# Patient Record
Sex: Female | Born: 2000 | Race: Black or African American | Hispanic: No | Marital: Single | Smoking: Never smoker
Health system: Southern US, Community
[De-identification: ages and names within clinical notes are randomized; demographics above are authoritative.]

## PROBLEM LIST (undated history)

## (undated) DIAGNOSIS — J45909 Unspecified asthma, uncomplicated: Secondary | ICD-10-CM

---
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
## 2018-07-15 ENCOUNTER — Encounter (HOSPITAL_BASED_OUTPATIENT_CLINIC_OR_DEPARTMENT_OTHER): Payer: Self-pay | Admitting: Emergency Medicine

## 2018-07-15 ENCOUNTER — Other Ambulatory Visit: Payer: Self-pay

## 2018-07-15 ENCOUNTER — Emergency Department (HOSPITAL_BASED_OUTPATIENT_CLINIC_OR_DEPARTMENT_OTHER)
Admission: EM | Admit: 2018-07-15 | Discharge: 2018-07-15 | Disposition: A | Discharge status: Home / Self Care | Payer: Medicaid Other | Attending: Emergency Medicine | Admitting: Emergency Medicine

## 2018-07-15 DIAGNOSIS — K029 Dental caries, unspecified: Secondary | ICD-10-CM | POA: Insufficient documentation

## 2018-07-15 DIAGNOSIS — K0889 Other specified disorders of teeth and supporting structures: Secondary | ICD-10-CM | POA: Insufficient documentation

## 2018-07-15 MED ORDER — PENICILLIN V POTASSIUM 500 MG PO TABS: 500.0000 mg | ORAL_TABLET | Freq: Three times a day (TID) | ORAL | 0 refills | Status: AC

## 2018-07-15 MED FILL — PENICILLIN VK 500 MG TABLET: 500 | 7 days supply | Qty: 21 | Fill #0

## 2018-07-15 NOTE — ED Provider Notes (Signed)
[facility] EMERGENCY DEPARTMENT Provider Note   CSN: [id number] Arrival date & time: 07/15/18  0845     History   Chief Complaint Chief Complaint  Patient presents with  . Dental Pain    HPI Julie Collins is a 18 y.o. female.  HPI Patient is a 18 year old female presents the emergency department with complaints of dental pain worsening over the past 24 to 48 hours.  She reports this is left lower dental pain.  No difficulty breathing or swallowing.  Pain is mild to moderate in severity.  She tried Tylenol last night with some improvement.  She does not have a dentist.  She denies anterior neck pain or swelling.  No other complaints.   History reviewed. No pertinent past medical history.  There are no active problems to display for this patient.   History reviewed. No pertinent surgical history.   OB History   No obstetric history on file.      Home Medications    Prior to Admission medications   Medication Sig Start Date End Date Taking? Authorizing Provider  penicillin v potassium (VEETID) 500 MG tablet Take 1 tablet (500 mg total) by mouth 3 (three) times daily. 07/15/18   Azalia Bilis, MD    Family History No family history on file.  Social History Social History   Tobacco Use  . Smoking status: Never Smoker  . Smokeless tobacco: Never Used  Substance Use Topics  . Alcohol use: Yes  . Drug use: No     Allergies   Patient has no known allergies.   Review of Systems Review of Systems  All other systems reviewed and are negative.    Physical Exam Updated Vital Signs BP (!) 141/75 (BP Location: Left Arm)   Pulse 99   Temp 99.8 F (37.7 C) (Oral)   Resp 16   Ht 5\' 2"  (1.575 m)   Wt 83.9 kg   LMP 07/06/2018   SpO2 99%   BMI 33.84 kg/m   Physical Exam Vitals signs and nursing note reviewed.  Constitutional:      Appearance: She is well-developed.  HENT:     Head: Normocephalic.     Mouth/Throat:     Mouth: Mucous membranes  are moist.     Pharynx: Oropharynx is clear.     Comments: Left lower second molar with mild tenderness.  No gingival swelling or fluctuance.  Space under her tongue is soft.  Tolerating secretions.  Oral airway patent. Neck:     Musculoskeletal: Normal range of motion and neck supple. No muscular tenderness.     Comments: No erythema Pulmonary:     Effort: Pulmonary effort is normal.  Abdominal:     General: There is no distension.  Musculoskeletal: Normal range of motion.  Lymphadenopathy:     Cervical: No cervical adenopathy.  Neurological:     Mental Status: She is alert and oriented to person, place, and time.      ED Treatments / Results  Labs (all labs ordered are listed, but only abnormal results are displayed) Labs Reviewed - No data to display  EKG None  Radiology No results found.  Procedures Procedures (including critical care time)  Medications Ordered in ED Medications - No data to display   Initial Impression / Assessment and Plan / ED Course  I have reviewed the triage vital signs and the nursing notes.  Pertinent labs & imaging results that were available during my care of the patient were reviewed by  me and considered in my medical decision making (see chart for details).     Dental Pain. Home with antibiotics and NSAIDs.  Recommend dental follow up. No signs of gingival abscess. Tolerating secretions. Airway patent. No sub lingular swelling   Final Clinical Impressions(s) / ED Diagnoses   Final diagnoses:  Dental caries  Pain, dental    ED Discharge Orders         Ordered    penicillin v potassium (VEETID) 500 MG tablet  3 times daily     07/15/18 0903           Azalia Bilis, MD 07/15/18 [phone]

## 2018-07-15 NOTE — Discharge Instructions (Addendum)
Take ibuprofen and tylenol for the pain  Please call a dentist for follow up

## 2018-07-15 NOTE — ED Notes (Signed)
ED Provider at bedside. 

## 2018-07-15 NOTE — ED Triage Notes (Signed)
L lower dental pain and swelling since yesterday.

## 2018-07-15 NOTE — ED Notes (Signed)
Pt verbalized understanding of dc instructions.

## 2020-06-30 ENCOUNTER — Ambulatory Visit: Payer: Medicaid Other | Attending: Internal Medicine

## 2020-06-30 DIAGNOSIS — Z23 Encounter for immunization: Secondary | ICD-10-CM

## 2020-06-30 NOTE — Progress Notes (Signed)
° °  Covid-19 Vaccination [facility]  Name:  Julie Collins    MRN: [id number] DOB: [birth year]  06/30/2020  Ms. Julie Collins was observed post Covid-19 immunization for 30 minutes based on pre-vaccination screening without incident. She was provided with Vaccine Information Sheet and instruction to access the V-Safe system.   Ms. Julie Collins was instructed to call 911 with any severe reactions post vaccine:  Difficulty breathing   Swelling of face and throat   A fast heartbeat   A bad rash all over body   Dizziness and weakness   Immunizations Administered    Name Date Dose VIS Date Route   Pfizer COVID-19 Vaccine 06/30/2020 11:53 AM 0.3 mL 04/27/2020 Intramuscular   Manufacturer: [employer], [employer]   Lot: [id number]   NDC: [address]-5

## 2020-10-24 ENCOUNTER — Encounter (HOSPITAL_BASED_OUTPATIENT_CLINIC_OR_DEPARTMENT_OTHER): Payer: Self-pay

## 2020-10-24 ENCOUNTER — Emergency Department (HOSPITAL_BASED_OUTPATIENT_CLINIC_OR_DEPARTMENT_OTHER): Payer: BC Managed Care – PPO

## 2020-10-24 ENCOUNTER — Emergency Department (HOSPITAL_BASED_OUTPATIENT_CLINIC_OR_DEPARTMENT_OTHER)
Admission: EM | Admit: 2020-10-24 | Discharge: 2020-10-24 | Disposition: A | Discharge status: Home / Self Care | Payer: BC Managed Care – PPO | Attending: Emergency Medicine | Admitting: Emergency Medicine

## 2020-10-24 ENCOUNTER — Other Ambulatory Visit: Payer: Self-pay

## 2020-10-24 ENCOUNTER — Other Ambulatory Visit (HOSPITAL_BASED_OUTPATIENT_CLINIC_OR_DEPARTMENT_OTHER): Payer: Self-pay

## 2020-10-24 DIAGNOSIS — F17200 Nicotine dependence, unspecified, uncomplicated: Secondary | ICD-10-CM | POA: Insufficient documentation

## 2020-10-24 DIAGNOSIS — N760 Acute vaginitis: Secondary | ICD-10-CM | POA: Diagnosis not present

## 2020-10-24 DIAGNOSIS — J45909 Unspecified asthma, uncomplicated: Secondary | ICD-10-CM | POA: Insufficient documentation

## 2020-10-24 DIAGNOSIS — Z20822 Contact with and (suspected) exposure to covid-19: Secondary | ICD-10-CM | POA: Diagnosis not present

## 2020-10-24 DIAGNOSIS — J101 Influenza due to other identified influenza virus with other respiratory manifestations: Secondary | ICD-10-CM

## 2020-10-24 DIAGNOSIS — R109 Unspecified abdominal pain: Secondary | ICD-10-CM | POA: Insufficient documentation

## 2020-10-24 DIAGNOSIS — J1089 Influenza due to other identified influenza virus with other manifestations: Secondary | ICD-10-CM | POA: Diagnosis not present

## 2020-10-24 DIAGNOSIS — R509 Fever, unspecified: Secondary | ICD-10-CM | POA: Diagnosis present

## 2020-10-24 DIAGNOSIS — B9689 Other specified bacterial agents as the cause of diseases classified elsewhere: Secondary | ICD-10-CM | POA: Insufficient documentation

## 2020-10-24 HISTORY — DX: Unspecified asthma, uncomplicated: J45.909

## 2020-10-24 LAB — CBC WITH DIFFERENTIAL/PLATELET
Abs Immature Granulocytes: 0.01 10*3/uL (ref 0.00–0.07)
Basophils Absolute: 0 10*3/uL (ref 0.0–0.1)
Basophils Relative: 0 %
Eosinophils Absolute: 0 10*3/uL (ref 0.0–0.5)
Eosinophils Relative: 0 %
HCT: 38.2 % (ref 36.0–46.0)
Hemoglobin: 12.1 g/dL (ref 12.0–15.0)
Immature Granulocytes: 0 %
Lymphocytes Relative: 12 %
Lymphs Abs: 0.4 10*3/uL — ABNORMAL LOW (ref 0.7–4.0)
MCH: 26.4 pg (ref 26.0–34.0)
MCHC: 31.7 g/dL (ref 30.0–36.0)
MCV: 83.2 fL (ref 80.0–100.0)
Monocytes Absolute: 0.5 10*3/uL (ref 0.1–1.0)
Monocytes Relative: 14 %
Neutro Abs: 2.5 10*3/uL (ref 1.7–7.7)
Neutrophils Relative %: 74 %
Platelets: 242 10*3/uL (ref 150–400)
RBC: 4.59 MIL/uL (ref 3.87–5.11)
RDW: 13.6 % (ref 11.5–15.5)
WBC: 3.3 10*3/uL — ABNORMAL LOW (ref 4.0–10.5)
nRBC: 0 % (ref 0.0–0.2)

## 2020-10-24 LAB — COMPREHENSIVE METABOLIC PANEL
ALT: 14 U/L (ref 0–44)
AST: 20 U/L (ref 15–41)
Albumin: 3.7 g/dL (ref 3.5–5.0)
Alkaline Phosphatase: 33 U/L — ABNORMAL LOW (ref 38–126)
Anion gap: 9 (ref 5–15)
BUN: 7 mg/dL (ref 6–20)
CO2: 22 mmol/L (ref 22–32)
Calcium: 8.8 mg/dL — ABNORMAL LOW (ref 8.9–10.3)
Chloride: 104 mmol/L (ref 98–111)
Creatinine, Ser: 0.64 mg/dL (ref 0.44–1.00)
GFR, Estimated: >60 mL/min (ref >60)
Glucose, Bld: 89 mg/dL (ref 70–99)
Potassium: 3.5 mmol/L (ref 3.5–5.1)
Sodium: 135 mmol/L (ref 135–145)
Total Bilirubin: 0.5 mg/dL (ref 0.3–1.2)
Total Protein: 7.7 g/dL (ref 6.5–8.1)

## 2020-10-24 LAB — RESP PANEL BY RT-PCR (FLU A&B, COVID) ARPGX2
Influenza A by PCR: POSITIVE — AB
Influenza B by PCR: NEGATIVE
SARS Coronavirus 2 by RT PCR: NEGATIVE

## 2020-10-24 LAB — URINALYSIS, ROUTINE W REFLEX MICROSCOPIC
Bilirubin Urine: NEGATIVE
Glucose, UA: NEGATIVE mg/dL
Hgb urine dipstick: NEGATIVE
Ketones, ur: 15 mg/dL — AB
Leukocytes,Ua: NEGATIVE
Nitrite: NEGATIVE
Protein, ur: NEGATIVE mg/dL
Specific Gravity, Urine: >1.03 — ABNORMAL HIGH (ref 1.005–1.030)
pH: 6 (ref 5.0–8.0)

## 2020-10-24 LAB — PREGNANCY, URINE: Preg Test, Ur: NEGATIVE

## 2020-10-24 LAB — WET PREP, GENITAL
Sperm: NONE SEEN
Trich, Wet Prep: NONE SEEN
Yeast Wet Prep HPF POC: NONE SEEN

## 2020-10-24 MED ORDER — FENTANYL CITRATE (PF) 100 MCG/2ML IJ SOLN
50.0000 ug | Freq: Once | INTRAMUSCULAR | Status: AC
  Administered 2020-10-24: 50 ug via INTRAVENOUS
  Filled 2020-10-24: qty 2

## 2020-10-24 MED ORDER — ONDANSETRON HCL 4 MG/2ML IJ SOLN
4.0000 mg | Freq: Once | INTRAMUSCULAR | Status: AC
  Administered 2020-10-24: 4 mg via INTRAVENOUS
  Filled 2020-10-24: qty 2

## 2020-10-24 MED ORDER — METRONIDAZOLE 0.75 % VA GEL
1.0000 | Freq: Every day | VAGINAL | 0 refills | Status: AC
  Filled 2020-10-24: qty 70, 7d supply, fill #0

## 2020-10-24 MED ORDER — OSELTAMIVIR PHOSPHATE 75 MG PO CAPS
75.0000 mg | ORAL_CAPSULE | Freq: Once | ORAL | Status: AC
  Administered 2020-10-24: 75 mg via ORAL
  Filled 2020-10-24: qty 1

## 2020-10-24 MED ORDER — KETOROLAC TROMETHAMINE 30 MG/ML IJ SOLN
30.0000 mg | Freq: Once | INTRAMUSCULAR | Status: AC
  Administered 2020-10-24: 30 mg via INTRAVENOUS
  Filled 2020-10-24: qty 1

## 2020-10-24 MED ORDER — OSELTAMIVIR PHOSPHATE 75 MG PO CAPS
75.0000 mg | ORAL_CAPSULE | Freq: Two times a day (BID) | ORAL | 0 refills | Status: AC
  Filled 2020-10-24: qty 10, 5d supply, fill #0

## 2020-10-24 MED ORDER — SODIUM CHLORIDE 0.9 % IV BOLUS
1000.0000 mL | Freq: Once | INTRAVENOUS | Status: AC
  Administered 2020-10-24: 1000 mL via INTRAVENOUS

## 2020-10-24 NOTE — ED Provider Notes (Signed)
[facility] EMERGENCY DEPARTMENT Provider Note   CSN: [id number] Arrival date & time: 10/24/20  3875     History Chief Complaint  Patient presents with  . Flank Pain    Julie Collins is a 20 y.o. female.  She took a home COVID test, and it was negative. Pain has been so severe that she has awoken from sleep due to pain.  The history is provided by the patient.  Back Pain Location:  Lumbar spine Quality:  Stabbing Radiates to:  Does not radiate Pain severity:  Severe Pain is:  Same all the time Onset quality:  Gradual Duration:  3 days Timing:  Constant Progression:  Worsening Chronicity:  New Context: not physical stress, not recent illness, not recent injury and not twisting   Relieved by:  Nothing Worsened by:  Nothing Ineffective treatments:  Ibuprofen Associated symptoms: bladder incontinence (feels the urge, dribbling on the way to the toilet), fever (subjective; drenched in sweat) and weakness (diffuse)   Associated symptoms: no abdominal pain, no bowel incontinence, no chest pain, no dysuria, no numbness and no paresthesias   Risk factors comment:  Fully vaccinated and boosted for COVID      Past Medical History:  Diagnosis Date  . Asthma     There are no problems to display for this patient.   No past surgical history on file.   OB History   No obstetric history on file.     No family history on file.  Social History   Tobacco Use  . Smoking status: Current Every Day Smoker  . Smokeless tobacco: Never Used  Substance Use Topics  . Alcohol use: Yes  . Drug use: No    Home Medications Prior to Admission medications   Medication Sig Start Date End Date Taking? Authorizing Provider  penicillin v potassium (VEETID) 500 MG tablet Take 1 tablet (500 mg total) by mouth 3 (three) times daily. 07/15/18   Azalia Bilis, MD    Allergies    Penicillins  Review of Systems   Review of Systems  Constitutional: Positive for fever  (subjective; drenched in sweat). Negative for chills.  HENT: Negative for ear pain and sore throat.   Eyes: Negative for pain and visual disturbance.  Respiratory: Positive for cough (mild; attributed to allergies). Negative for shortness of breath.   Cardiovascular: Negative for chest pain and palpitations.  Gastrointestinal: Positive for constipation and nausea. Negative for abdominal pain, bowel incontinence, diarrhea and vomiting.  Genitourinary: Positive for bladder incontinence (feels the urge, dribbling on the way to the toilet) and vaginal discharge (malodorous). Negative for dysuria and hematuria.  Musculoskeletal: Positive for back pain. Negative for arthralgias.  Skin: Negative for color change and rash.  Neurological: Positive for weakness (diffuse). Negative for seizures, syncope, numbness and paresthesias.  All other systems reviewed and are negative.   Physical Exam Updated Vital Signs BP (!) 146/93 (BP Location: Right Arm)   Pulse 82   Temp 98.5 F (36.9 C) (Oral)   Resp 18   Ht 5\' 3"  (1.6 m)   Wt 89.4 kg   SpO2 98%   BMI 34.90 kg/m   Physical Exam Vitals and nursing note reviewed. Exam conducted with a chaperone present.  Constitutional:      Comments: Tearful, appears to be in pain  HENT:     Head: Normocephalic and atraumatic.  Eyes:     General: No scleral icterus. Pulmonary:     Effort: Pulmonary effort is normal. No respiratory distress.  Abdominal:     Tenderness: There is abdominal tenderness. There is left CVA tenderness. There is no guarding or rebound.     Comments: Mild LLQ tenderness  Genitourinary:    General: Normal vulva.     Labia:        Right: No rash, tenderness or lesion.        Left: No rash, tenderness or lesion.      Vagina: Vaginal discharge (white) present.     Cervix: Cervical motion tenderness present.     Uterus: Normal.      Adnexa: Right adnexa normal and left adnexa normal.     Comments: CMT might be more uterine/bladder  as all pain was midline Musculoskeletal:     Cervical back: Normal range of motion.     Comments: Mild, diffuse tenderness over the lumbar spine  Skin:    General: Skin is warm and dry.  Neurological:     General: No focal deficit present.     Mental Status: She is alert and oriented to person, place, and time.     Sensory: No sensory deficit.  Psychiatric:        Mood and Affect: Mood normal.     ED Results / Procedures / Treatments   Labs (all labs ordered are listed, but only abnormal results are displayed) Labs Reviewed  RESP PANEL BY RT-PCR (FLU A&B, COVID) ARPGX2 - Abnormal; Notable for the following components:      Result Value   Influenza A by PCR POSITIVE (*)    All other components within normal limits  WET PREP, GENITAL - Abnormal; Notable for the following components:   Clue Cells Wet Prep HPF POC PRESENT (*)    WBC, Wet Prep HPF POC MODERATE (*)    All other components within normal limits  COMPREHENSIVE METABOLIC PANEL - Abnormal; Notable for the following components:   Calcium 8.8 (*)    Alkaline Phosphatase 33 (*)    All other components within normal limits  CBC WITH DIFFERENTIAL/PLATELET - Abnormal; Notable for the following components:   WBC 3.3 (*)    Lymphs Abs 0.4 (*)    All other components within normal limits  URINALYSIS, ROUTINE W REFLEX MICROSCOPIC - Abnormal; Notable for the following components:   Specific Gravity, Urine >1.030 (*)    Ketones, ur 15 (*)    All other components within normal limits  PREGNANCY, URINE  GC/CHLAMYDIA PROBE AMP ([place]) NOT AT [facility]    EKG None  Radiology CT Renal Stone Study  Result Date: 10/24/2020 CLINICAL DATA:  Flank pain EXAM: CT ABDOMEN AND PELVIS WITHOUT CONTRAST TECHNIQUE: Multidetector CT imaging of the abdomen and pelvis was performed following the standard protocol without IV contrast. COMPARISON:  July 2021 FINDINGS: Lower chest: No acute abnormality. Hepatobiliary: Liver dome is partially  imaged. No focal liver abnormality identified. Gallbladder is unremarkable. No biliary dilatation. Pancreas: Unremarkable. Spleen: Unremarkable. Adrenals/Urinary Tract: Adrenals are unremarkable. There is no hydronephrosis. No renal calculi. Bladder is poorly distended. Stomach/Bowel: Stomach is within normal limits. Bowel is normal in caliber. Vascular/Lymphatic: No significant vascular abnormality. No enlarged lymph nodes identified. Reproductive: Uterus and bilateral adnexa are unremarkable. Other: Abdominal wall is unremarkable. Musculoskeletal: No acute osseous abnormality. IMPRESSION: No acute abnormality. Electronically Signed   By: Guadlupe Spanish M.D.   On: 10/24/2020 10:00    Procedures Procedures   Medications Ordered in ED Medications  sodium chloride 0.9 % bolus 1,000 mL (has no administration in time range)  ketorolac (  TORADOL) 30 MG/ML injection 30 mg (has no administration in time range)    ED Course  I have reviewed the triage vital signs and the nursing notes.  Pertinent labs & imaging results that were available during my care of the patient were reviewed by me and considered in my medical decision making (see chart for details).    MDM Rules/Calculators/A&P                          Ms. Paulita Fujita presented to the ED complaining of severe back pain.  She was endorsing some subjective fevers and chills, and I was concerned about COVID-19 as her symptoms seem to be related to myalgias.  However, I was also concerned about other etiologies of her symptoms such as kidney stone, pyelonephritis, pelvic inflammatory disease, and much less likely given her lack of risk factors, spinal epidural abscess or other infectious lumbosacral process.  She tested positive for influenza A which is likely the cause of her symptoms.  She will be given Tamiflu.  She did also test positive for BV and has been experiencing some malodorous discharge.  Therefore, I will give her metronidazole. Final  Clinical Impression(s) / ED Diagnoses Final diagnoses:  Influenza A  Bacterial vaginitis    Rx / DC Orders ED Discharge Orders         Ordered    oseltamivir (TAMIFLU) 75 MG capsule  Every 12 hours        10/24/20 1012    metroNIDAZOLE (METROGEL VAGINAL) 0.75 % vaginal gel  Daily at bedtime        10/24/20 1013           Koleen Distance, MD 10/24/20 1015

## 2020-10-24 NOTE — ED Triage Notes (Signed)
Bilateral flank pain "a little worse on the right" with nausea since Saturday afternoon, denies injury.

## 2020-10-25 LAB — GC/CHLAMYDIA PROBE AMP (~~LOC~~) NOT AT ARMC
Chlamydia: NEGATIVE
Comment: NEGATIVE
Comment: NORMAL
Neisseria Gonorrhea: NEGATIVE

## 2021-10-26 ENCOUNTER — Encounter (HOSPITAL_COMMUNITY): Payer: Self-pay | Admitting: Emergency Medicine

## 2021-10-26 ENCOUNTER — Other Ambulatory Visit: Payer: Self-pay

## 2021-10-26 ENCOUNTER — Emergency Department (HOSPITAL_COMMUNITY)
Admission: EM | Admit: 2021-10-26 | Discharge: 2021-10-26 | Disposition: A | Discharge status: Home / Self Care | Payer: Medicaid Other | Attending: Emergency Medicine | Admitting: Emergency Medicine

## 2021-10-26 DIAGNOSIS — R45851 Suicidal ideations: Secondary | ICD-10-CM | POA: Insufficient documentation

## 2021-10-26 DIAGNOSIS — F419 Anxiety disorder, unspecified: Secondary | ICD-10-CM | POA: Diagnosis not present

## 2021-10-26 DIAGNOSIS — Z046 Encounter for general psychiatric examination, requested by authority: Secondary | ICD-10-CM | POA: Diagnosis not present

## 2021-10-26 DIAGNOSIS — F322 Major depressive disorder, single episode, severe without psychotic features: Secondary | ICD-10-CM | POA: Insufficient documentation

## 2021-10-26 LAB — COMPREHENSIVE METABOLIC PANEL
ALT: 15 U/L (ref 0–44)
AST: 19 U/L (ref 15–41)
Albumin: 4.2 g/dL (ref 3.5–5.0)
Alkaline Phosphatase: 37 U/L — ABNORMAL LOW (ref 38–126)
Anion gap: 9 (ref 5–15)
BUN: 9 mg/dL (ref 6–20)
CO2: 20 mmol/L — ABNORMAL LOW (ref 22–32)
Calcium: 9.2 mg/dL (ref 8.9–10.3)
Chloride: 106 mmol/L (ref 98–111)
Creatinine, Ser: 0.7 mg/dL (ref 0.44–1.00)
GFR, Estimated: >60 mL/min (ref >60)
Glucose, Bld: 89 mg/dL (ref 70–99)
Potassium: 3.8 mmol/L (ref 3.5–5.1)
Sodium: 135 mmol/L (ref 135–145)
Total Bilirubin: 0.9 mg/dL (ref 0.3–1.2)
Total Protein: 8 g/dL (ref 6.5–8.1)

## 2021-10-26 LAB — CBC
HCT: 37.4 % (ref 36.0–46.0)
Hemoglobin: 12.5 g/dL (ref 12.0–15.0)
MCH: 28.2 pg (ref 26.0–34.0)
MCHC: 33.4 g/dL (ref 30.0–36.0)
MCV: 84.4 fL (ref 80.0–100.0)
Platelets: 295 10*3/uL (ref 150–400)
RBC: 4.43 MIL/uL (ref 3.87–5.11)
RDW: 13.1 % (ref 11.5–15.5)
WBC: 8.9 10*3/uL (ref 4.0–10.5)
nRBC: 0 % (ref 0.0–0.2)

## 2021-10-26 LAB — ETHANOL: Alcohol, Ethyl (B): <10 mg/dL (ref <10)

## 2021-10-26 LAB — HCG, QUANTITATIVE, PREGNANCY: hCG, Beta Chain, Quant, S: 2986 m[IU]/mL — ABNORMAL HIGH (ref <5)

## 2021-10-26 LAB — RAPID URINE DRUG SCREEN, HOSP PERFORMED
Amphetamines: NOT DETECTED
Barbiturates: NOT DETECTED
Benzodiazepines: NOT DETECTED
Cocaine: NOT DETECTED
Opiates: NOT DETECTED
Tetrahydrocannabinol: POSITIVE — AB

## 2021-10-26 LAB — I-STAT BETA HCG BLOOD, ED (MC, WL, AP ONLY): I-stat hCG, quantitative: >2000 m[IU]/mL — ABNORMAL HIGH (ref <5)

## 2021-10-26 LAB — SALICYLATE LEVEL: Salicylate Lvl: <7 mg/dL — ABNORMAL LOW (ref 7.0–30.0)

## 2021-10-26 LAB — ACETAMINOPHEN LEVEL: Acetaminophen (Tylenol), Serum: <10 ug/mL — ABNORMAL LOW (ref 10–30)

## 2021-10-26 NOTE — BH Assessment (Signed)
TTS completed, discussed clinicals with Dr. Nelly Rout and she recommends psych clearance. Patient does not meet criteria for inpatient psychiatric treatment. Please rescind IVC. Patient ok to discharge home with her mother. Resources added to AVS to the [facility] and [facility] the [place] for outpatient therapy and medication management.  ?

## 2021-10-26 NOTE — Discharge Instructions (Signed)
Begin taking a prenatal vitamin asap in until you decide what you want to do. ?The easiest way to [facility] is to go to there website. ?Get help right away if you feel like you want to hurt yourself or anyone else. ? ?
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
## 2021-10-26 NOTE — ED Notes (Addendum)
Pt was upset and emotional due to she was informed by provider she is pregnant. Pt expressed she wanted female staff member to "vent" with. I had a female tech to come in and talk with pt regarding this. Pt expressed appreciation for this.  ?
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
## 2021-10-26 NOTE — ED Notes (Signed)
I provided reinforced discharge education based off of discharge instructions. Pt acknowledged and understood my education. Pt had no further questions/concerns for provider/myself.  °

## 2021-10-26 NOTE — ED Notes (Signed)
Pt belonging: white shoes, blue jean, white shirt, one set of car keys, white earring, gold chain  ?
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
## 2021-10-26 NOTE — ED Notes (Signed)
Pt provided ginger ale per request.

## 2022-07-13 ENCOUNTER — Other Ambulatory Visit: Payer: Self-pay

## 2022-07-13 ENCOUNTER — Emergency Department (HOSPITAL_BASED_OUTPATIENT_CLINIC_OR_DEPARTMENT_OTHER)
Admission: EM | Admit: 2022-07-13 | Discharge: 2022-07-13 | Discharge status: Against Medical Advice | Payer: Medicaid Other | Attending: Emergency Medicine | Admitting: Emergency Medicine

## 2022-07-13 ENCOUNTER — Encounter (HOSPITAL_BASED_OUTPATIENT_CLINIC_OR_DEPARTMENT_OTHER): Payer: Self-pay | Admitting: Emergency Medicine

## 2022-07-13 DIAGNOSIS — Z5321 Procedure and treatment not carried out due to patient leaving prior to being seen by health care provider: Secondary | ICD-10-CM | POA: Insufficient documentation

## 2022-07-13 DIAGNOSIS — R350 Frequency of micturition: Secondary | ICD-10-CM | POA: Diagnosis present

## 2022-07-13 DIAGNOSIS — N644 Mastodynia: Secondary | ICD-10-CM | POA: Diagnosis not present

## 2022-07-13 DIAGNOSIS — R102 Pelvic and perineal pain: Secondary | ICD-10-CM | POA: Insufficient documentation

## 2022-07-13 LAB — URINALYSIS, ROUTINE W REFLEX MICROSCOPIC
Bilirubin Urine: NEGATIVE
Glucose, UA: NEGATIVE mg/dL
Hgb urine dipstick: NEGATIVE
Ketones, ur: NEGATIVE mg/dL
Leukocytes,Ua: NEGATIVE
Nitrite: NEGATIVE
Protein, ur: NEGATIVE mg/dL
Specific Gravity, Urine: 1.015 (ref 1.005–1.030)
pH: 7 (ref 5.0–8.0)

## 2022-07-13 LAB — HCG, QUANTITATIVE, PREGNANCY: hCG, Beta Chain, Quant, S: <1 m[IU]/mL (ref <5)

## 2022-07-13 LAB — PREGNANCY, URINE: Preg Test, Ur: NEGATIVE

## 2022-07-13 NOTE — ED Provider Triage Note (Signed)
Emergency Medicine Provider Triage Evaluation Note  Julie Collins , a 22 y.o. female  was evaluated in triage.  Pt complains of possible pregnancy.  Notes increased breast tenderness, increased urinary frequency, pelvic pain, and 2 months late for menstrual cycle..  No hx of irregular cycles per pt.  States these symptoms occurred last time she was pregnant.  Denies vaginal discharge or bleeding.  Denies dysuria.  Denies fever or chills.  Tried two home pregnancy tests at home, the first was negative, the second she believes may have had a faint positive test.  Review of Systems  Positive:  Negative: See above  Physical Exam  BP (!) 170/97 (BP Location: Left Arm)   Pulse 77   Temp 97.7 F (36.5 C) (Oral)   Resp 16   Wt 88.9 kg   LMP 05/27/2022   SpO2 94%   BMI 34.72 kg/m  Gen:   Awake, no distress   Resp:  Normal effort  MSK:   Moves extremities without difficulty  Other:  Sitting comfortably. Abdomen soft, mild suprapubic tenderness.  Nondistended. No flank tenderness.  Medical Decision Making  Medically screening exam initiated at 1:14 PM.  Appropriate orders placed.  Julie Collins was informed that the remainder of the evaluation will be completed by another provider, this initial triage assessment does not replace that evaluation, and the importance of remaining in the ED until their evaluation is complete.  Urine pregnancy negative.  Discussed blood analysis vs urine analysis, pt elects to proceed with blood analysis to be sure.  UA ordered to assess for possible UTI.   Prince Rome, PA-C 64/33/29 1323

## 2022-07-13 NOTE — ED Notes (Signed)
Called in [place], 2nd call no answer

## 2022-07-13 NOTE — ED Triage Notes (Signed)
Pt arrives pov, steady gait, reports taking plan B in November, requesting pregnancy test. Endorses pelvic pain today

## 2022-07-13 NOTE — ED Notes (Signed)
Called in [place], no answer

## 2022-07-13 NOTE — ED Notes (Signed)
Called in [place], no answer, 3rd call

## 2022-07-13 NOTE — ED Notes (Signed)
Attempted lab collect X1 L AC, unsuccessful. Attempted X2 L hand and was able to collect ordered labs

## 2022-07-19 ENCOUNTER — Encounter: Payer: Medicaid Other | Admitting: Nurse Practitioner

## 2022-07-27 ENCOUNTER — Encounter: Payer: Medicaid Other | Admitting: Radiology

## 2023-01-18 ENCOUNTER — Ambulatory Visit: Payer: Medicaid Other | Admitting: Nurse Practitioner

## 2023-01-25 ENCOUNTER — Ambulatory Visit: Payer: Self-pay | Admitting: Family Medicine

## 2023-01-25 ENCOUNTER — Ambulatory Visit: Payer: Medicaid Other | Admitting: Nurse Practitioner

## 2023-04-16 IMAGING — CT CT RENAL STONE PROTOCOL
2 of 4 series · 17 of 46 positions shown, 19 images · non-contrast
Comparison: January 2020

CLINICAL DATA: Flank pain

EXAM:
CT ABDOMEN AND PELVIS WITHOUT CONTRAST
TECHNIQUE: Multidetector CT imaging of the abdomen and pelvis was performed
following the standard protocol without IV contrast.

[Series 2: axial st · axial · 0.98mm/px · z∈[-468,-93]mm · 14 of 81 slices shown, 16 images]
[im 3/81  soft-tissue]
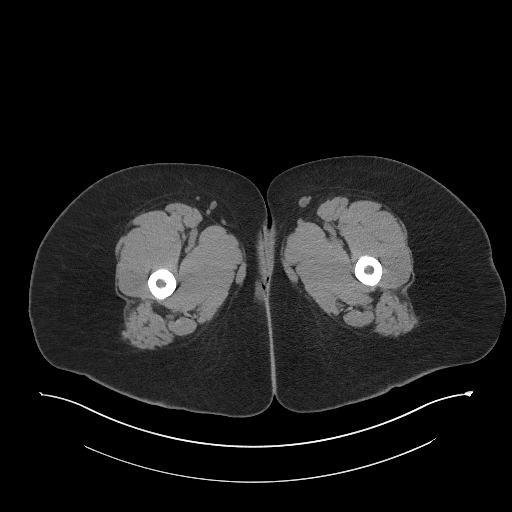
[im 3/81  bone]
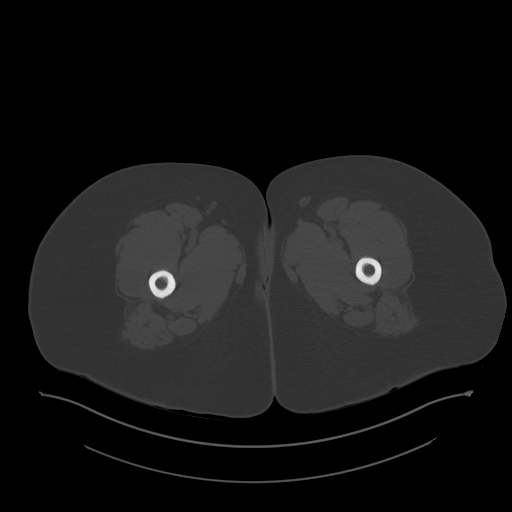
[im 9/81  soft-tissue]
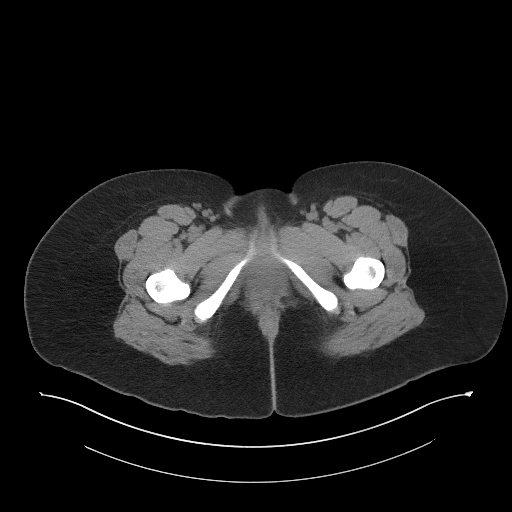
[im 15/81  soft-tissue]
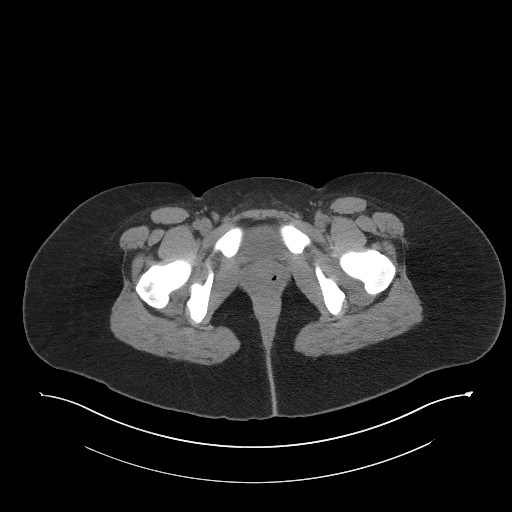
[im 21/81  soft-tissue]
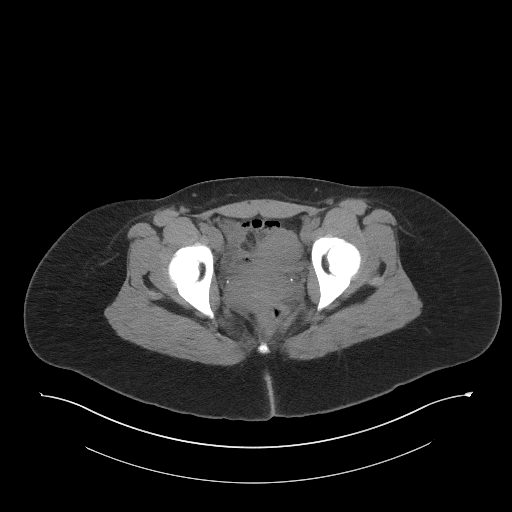
[im 27/81  soft-tissue]
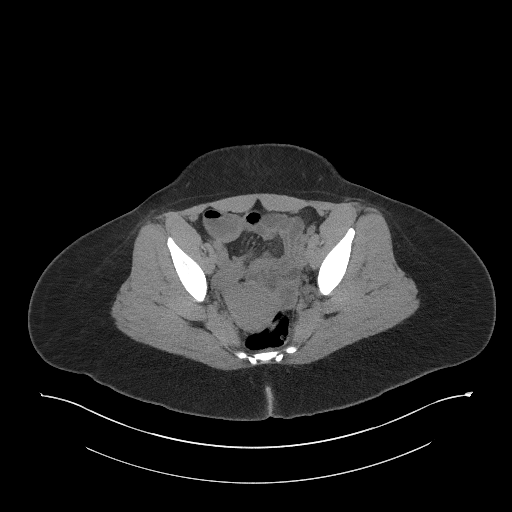
[im 33/81  soft-tissue]
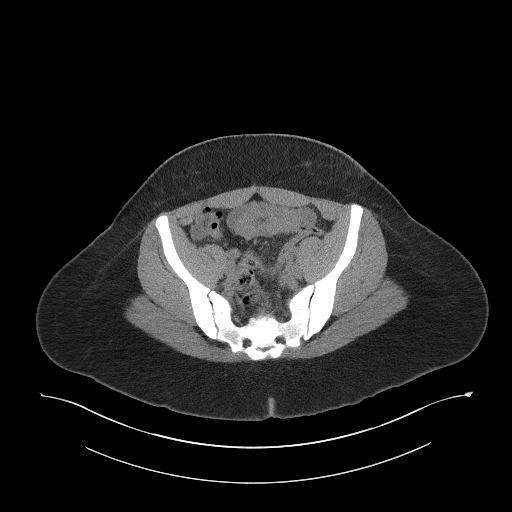
[im 39/81  soft-tissue]
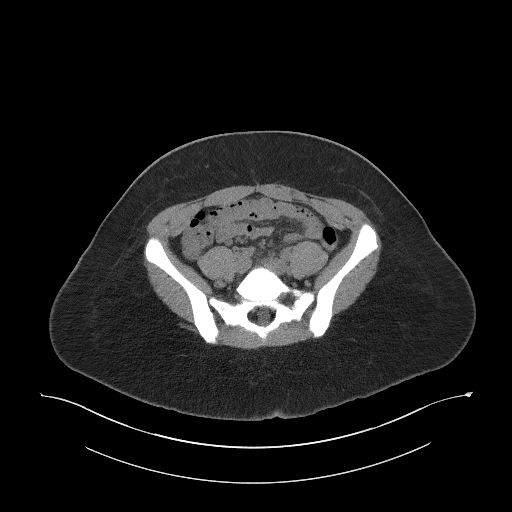
[im 42/81  soft-tissue]
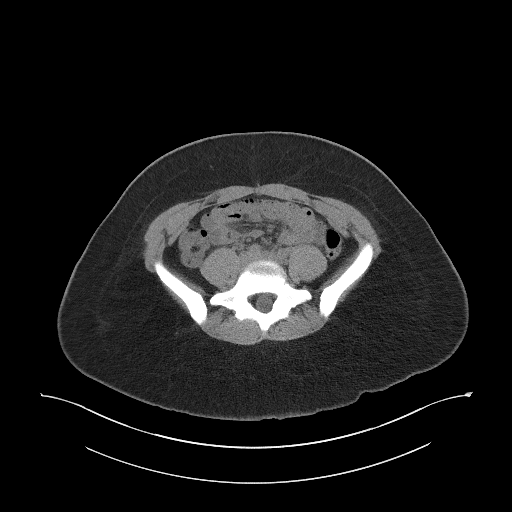
[im 48/81  soft-tissue]
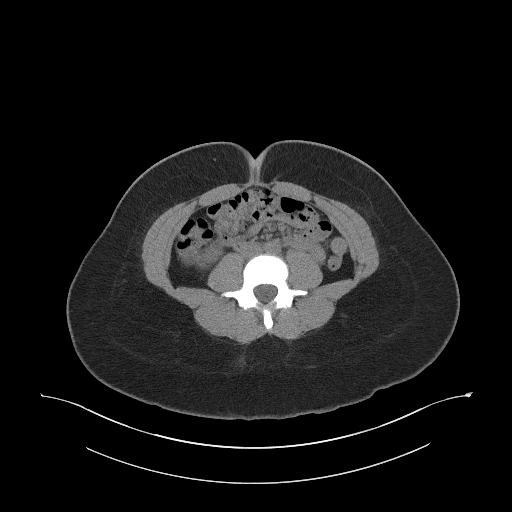
[im 48/81  bone]
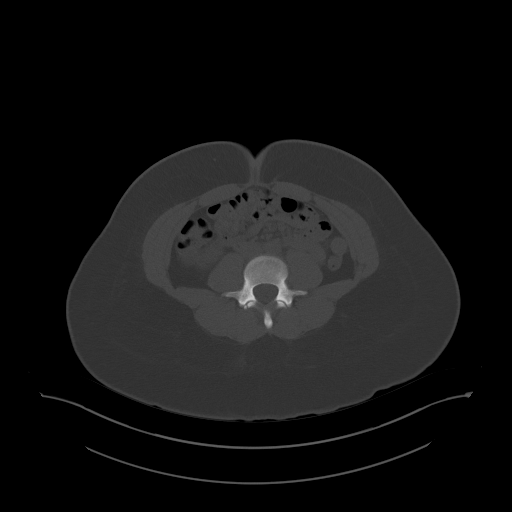
[im 54/81  soft-tissue]
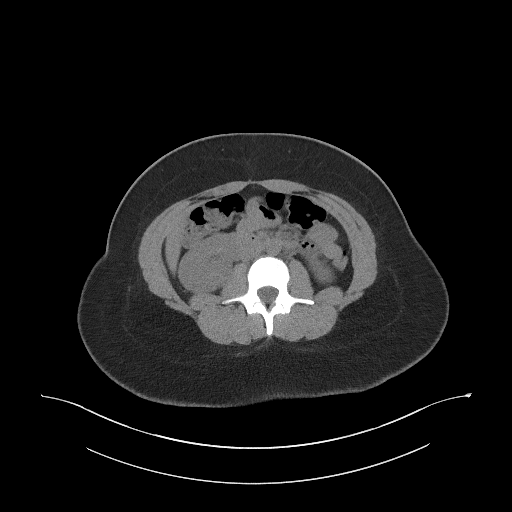
[im 60/81  soft-tissue]
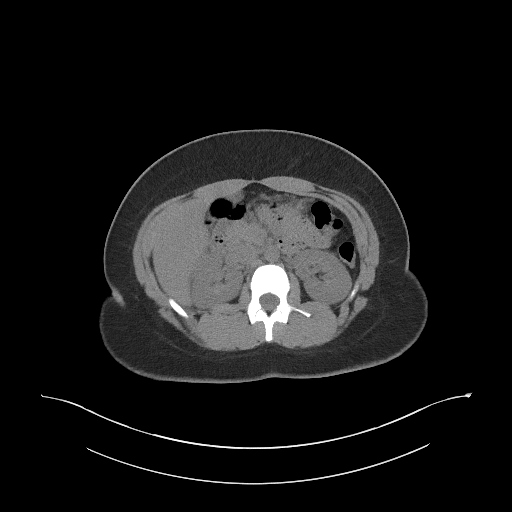
[im 66/81  soft-tissue]
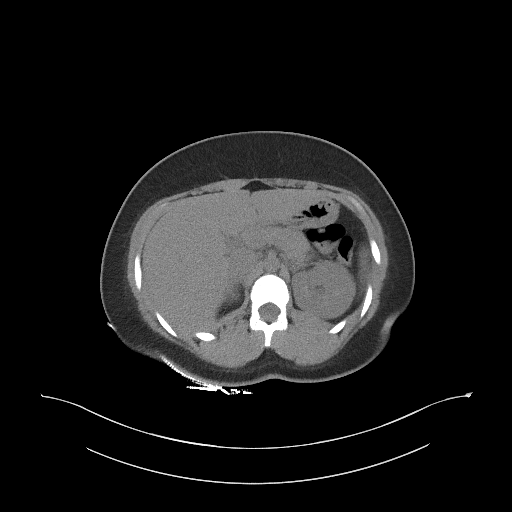
[im 72/81  soft-tissue]
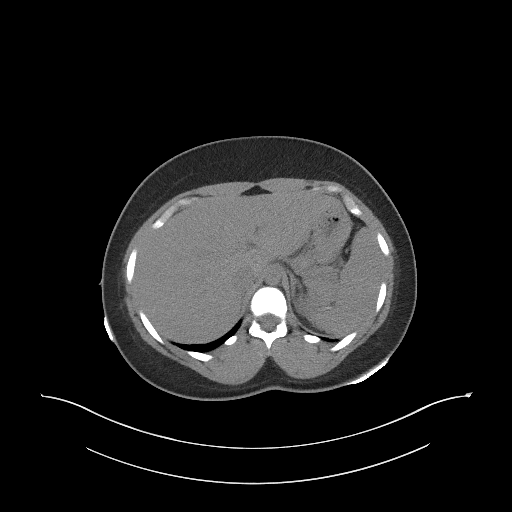
[im 78/81  soft-tissue]
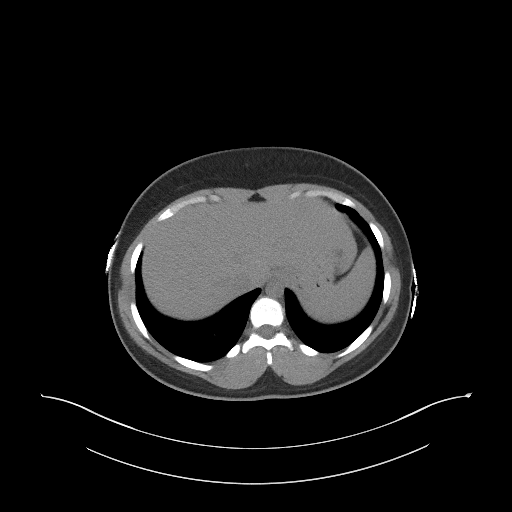

[Series 5: coronal st · coronal · 0.86mm/px · 3 of 97 slices shown]
[im 33/97  soft-tissue]
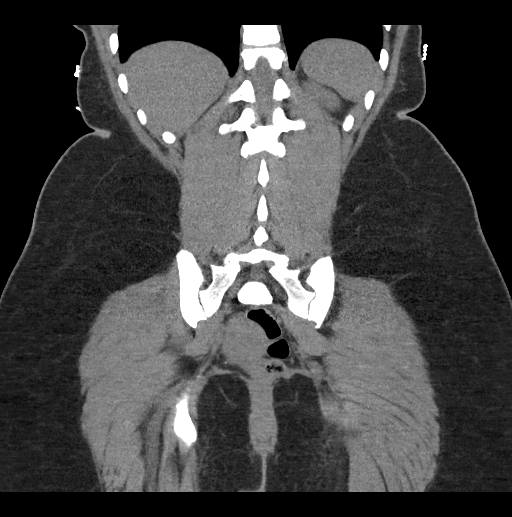
[im 43/97  soft-tissue]
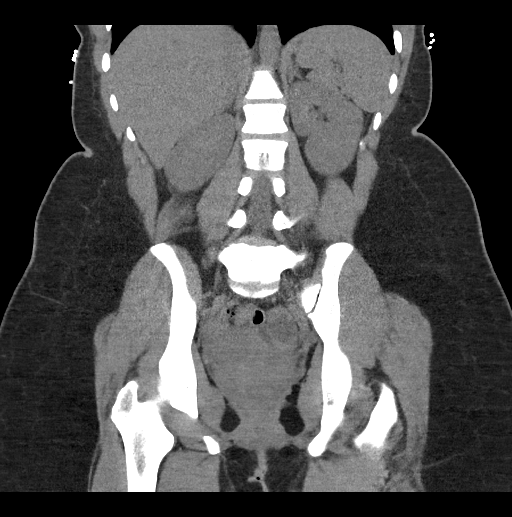
[im 54/97  soft-tissue]
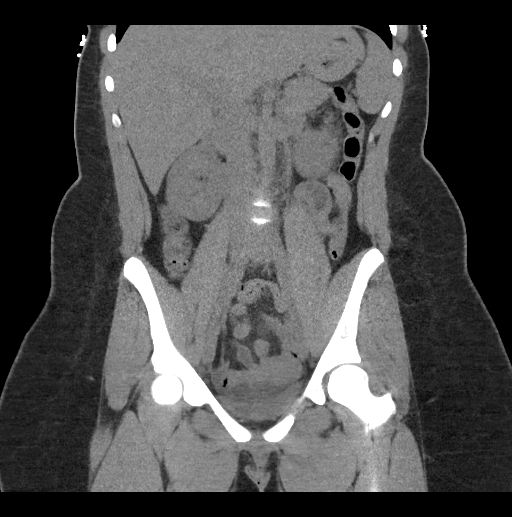

[17 of 46 positions shown; findings below may reference images not displayed]

FINDINGS: Lower chest: No acute abnormality.

Hepatobiliary: Liver dome is partially imaged. No focal liver
abnormality identified. Gallbladder is unremarkable. No biliary
dilatation.

Pancreas: Unremarkable.

Spleen: Unremarkable.

Adrenals/Urinary Tract: Adrenals are unremarkable. There is no
hydronephrosis. No renal calculi. Bladder is poorly distended.

Stomach/Bowel: Stomach is within normal limits. Bowel is normal in
caliber.

Vascular/Lymphatic: No significant vascular abnormality. No enlarged
lymph nodes identified.

Reproductive: Uterus and bilateral adnexa are unremarkable.

Other: Abdominal wall is unremarkable.

Musculoskeletal: No acute osseous abnormality.
IMPRESSION: No acute abnormality.

## 2023-06-13 ENCOUNTER — Emergency Department (HOSPITAL_BASED_OUTPATIENT_CLINIC_OR_DEPARTMENT_OTHER)
Admission: EM | Admit: 2023-06-13 | Discharge: 2023-06-14 | Disposition: A | Discharge status: Home / Self Care | Payer: Medicaid Other | Attending: Emergency Medicine | Admitting: Emergency Medicine

## 2023-06-13 ENCOUNTER — Other Ambulatory Visit: Payer: Self-pay

## 2023-06-13 ENCOUNTER — Encounter (HOSPITAL_BASED_OUTPATIENT_CLINIC_OR_DEPARTMENT_OTHER): Payer: Self-pay

## 2023-06-13 DIAGNOSIS — M549 Dorsalgia, unspecified: Secondary | ICD-10-CM | POA: Diagnosis present

## 2023-06-13 DIAGNOSIS — M7918 Myalgia, other site: Secondary | ICD-10-CM

## 2023-06-13 DIAGNOSIS — Y92410 Unspecified street and highway as the place of occurrence of the external cause: Secondary | ICD-10-CM | POA: Insufficient documentation

## 2023-06-13 DIAGNOSIS — M62830 Muscle spasm of back: Secondary | ICD-10-CM | POA: Diagnosis not present
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
## 2023-06-14 MED ORDER — IBUPROFEN 400 MG PO TABS
600.0000 mg | ORAL_TABLET | Freq: Once | ORAL | Status: AC
  Administered 2023-06-14: 600 mg via ORAL
  Filled 2023-06-14: qty 1

## 2023-06-14 MED ORDER — METHOCARBAMOL 500 MG PO TABS: 500.0000 mg | ORAL_TABLET | Freq: Two times a day (BID) | ORAL | 0 refills | Status: AC

## 2023-06-14 MED ORDER — NAPROXEN 500 MG PO TABS: 500.0000 mg | ORAL_TABLET | Freq: Two times a day (BID) | ORAL | 0 refills | Status: AC
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
## 2023-06-18 ENCOUNTER — Emergency Department (HOSPITAL_BASED_OUTPATIENT_CLINIC_OR_DEPARTMENT_OTHER): Payer: Medicaid Other

## 2023-06-18 ENCOUNTER — Encounter (HOSPITAL_BASED_OUTPATIENT_CLINIC_OR_DEPARTMENT_OTHER): Payer: Self-pay | Admitting: Emergency Medicine

## 2023-06-18 ENCOUNTER — Emergency Department (HOSPITAL_BASED_OUTPATIENT_CLINIC_OR_DEPARTMENT_OTHER)
Admission: EM | Admit: 2023-06-18 | Discharge: 2023-06-18 | Disposition: A | Discharge status: Home / Self Care | Payer: Medicaid Other | Attending: Emergency Medicine | Admitting: Emergency Medicine

## 2023-06-18 ENCOUNTER — Other Ambulatory Visit: Payer: Self-pay

## 2023-06-18 DIAGNOSIS — Y92410 Unspecified street and highway as the place of occurrence of the external cause: Secondary | ICD-10-CM | POA: Insufficient documentation

## 2023-06-18 DIAGNOSIS — M25561 Pain in right knee: Secondary | ICD-10-CM | POA: Diagnosis present

## 2023-06-18 DIAGNOSIS — S8991XA Unspecified injury of right lower leg, initial encounter: Secondary | ICD-10-CM | POA: Diagnosis not present

## 2023-06-18 DIAGNOSIS — J45909 Unspecified asthma, uncomplicated: Secondary | ICD-10-CM | POA: Diagnosis not present
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
## 2023-06-18 NOTE — ED Notes (Signed)
Reviewed D/C information with the patient, pt verbalized understanding. Return demonstration of the crutches performed by patient. No additional concerns at this time.

## 2023-12-18 ENCOUNTER — Ambulatory Visit: Admission: EM | Admit: 2023-12-18 | Discharge: 2023-12-18 | Disposition: A | Discharge status: Home / Self Care

## 2023-12-18 ENCOUNTER — Encounter: Payer: Self-pay | Admitting: Emergency Medicine

## 2023-12-18 DIAGNOSIS — B349 Viral infection, unspecified: Secondary | ICD-10-CM | POA: Diagnosis not present

## 2023-12-18 LAB — POC COVID19/FLU A&B COMBO
Covid Antigen, POC: NEGATIVE
Influenza A Antigen, POC: NEGATIVE
Influenza B Antigen, POC: NEGATIVE

## 2023-12-18 MED ORDER — AZELASTINE HCL 0.1 % NA SOLN: 1.0000 | Freq: Two times a day (BID) | NASAL | 1 refills | Status: AC

## 2023-12-18 MED ORDER — ONDANSETRON 4 MG PO TBDP
4.0000 mg | ORAL_TABLET | Freq: Once | ORAL | Status: AC
  Administered 2023-12-18: 4 mg via ORAL

## 2023-12-18 MED ORDER — ONDANSETRON 4 MG PO TBDP: 4.0000 mg | ORAL_TABLET | Freq: Three times a day (TID) | ORAL | 0 refills | Status: AC | PRN

## 2023-12-18 MED ORDER — PROMETHAZINE-DM 6.25-15 MG/5ML PO SYRP: 5.0000 mL | ORAL_SOLUTION | Freq: Four times a day (QID) | ORAL | 0 refills | Status: AC | PRN
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
## 2023-12-18 NOTE — ED Triage Notes (Signed)
 Pt states Monday she felt her throat was closing up, congestion, fatigue and cough. She threw once yesterday.

## 2023-12-18 NOTE — Discharge Instructions (Addendum)
  1. Acute viral syndrome (Primary) - ondansetron  (ZOFRAN -ODT) disintegrating tablet 4 mg given in UC for acute nausea - POC Covid19/Flu A&B Antigen performed in UC is negative for COVID and influenza - azelastine (ASTELIN) 0.1 % nasal spray; Place 1 spray into both nostrils 2 (two) times daily. Use in each nostril as directed   - promethazine-dextromethorphan (PROMETHAZINE-DM) 6.25-15 MG/5ML syrup; Take 5 mLs by mouth 4 (four) times daily as needed for cough.  - ondansetron  (ZOFRAN -ODT) 4 MG disintegrating tablet; Take 1 tablet (4 mg total) by mouth every 8 (eight) hours as needed for nausea or vomiting.  -Continue to monitor symptoms for any change in severity if there is any escalation of current symptoms or development of new symptoms follow-up in ER for further evaluation and management.
